# Patient Record
Sex: Female | Born: 1990 | Race: Black or African American | Hispanic: No | Marital: Single | Smoking: Never smoker
Health system: Southern US, Community
[De-identification: ages and names within clinical notes are randomized; demographics above are authoritative.]

---
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
## 2017-05-04 ENCOUNTER — Other Ambulatory Visit: Payer: Self-pay

## 2017-05-04 ENCOUNTER — Emergency Department (HOSPITAL_COMMUNITY)
Admission: EM | Admit: 2017-05-04 | Discharge: 2017-05-04 | Disposition: A | Discharge status: Home / Self Care | Payer: Self-pay | Attending: Emergency Medicine | Admitting: Emergency Medicine

## 2017-05-04 ENCOUNTER — Encounter (HOSPITAL_COMMUNITY): Payer: Self-pay

## 2017-05-04 DIAGNOSIS — N898 Other specified noninflammatory disorders of vagina: Secondary | ICD-10-CM | POA: Insufficient documentation

## 2017-05-04 LAB — I-STAT BETA HCG BLOOD, ED (MC, WL, AP ONLY): I-stat hCG, quantitative: <5 m[IU]/mL (ref <5)

## 2017-05-04 LAB — WET PREP, GENITAL
Clue Cells Wet Prep HPF POC: NONE SEEN
Sperm: NONE SEEN
Trich, Wet Prep: NONE SEEN
Yeast Wet Prep HPF POC: NONE SEEN

## 2017-05-04 LAB — CBC
HCT: 35.4 % — ABNORMAL LOW (ref 36.0–46.0)
Hemoglobin: 11.6 g/dL — ABNORMAL LOW (ref 12.0–15.0)
MCH: 27.4 pg (ref 26.0–34.0)
MCHC: 32.8 g/dL (ref 30.0–36.0)
MCV: 83.7 fL (ref 78.0–100.0)
PLATELETS: 365 10*3/uL (ref 150–400)
RBC: 4.23 MIL/uL (ref 3.87–5.11)
RDW: 14.5 % (ref 11.5–15.5)
WBC: 4.9 10*3/uL (ref 4.0–10.5)

## 2017-05-04 LAB — COMPREHENSIVE METABOLIC PANEL
ALT: 18 U/L (ref 14–54)
AST: 26 U/L (ref 15–41)
Albumin: 3.9 g/dL (ref 3.5–5.0)
Alkaline Phosphatase: 48 U/L (ref 38–126)
Anion gap: 8 (ref 5–15)
BUN: 7 mg/dL (ref 6–20)
CO2: 24 mmol/L (ref 22–32)
CREATININE: 0.89 mg/dL (ref 0.44–1.00)
Calcium: 9.3 mg/dL (ref 8.9–10.3)
Chloride: 107 mmol/L (ref 101–111)
Glucose, Bld: 87 mg/dL (ref 65–99)
Potassium: 4.2 mmol/L (ref 3.5–5.1)
Sodium: 139 mmol/L (ref 135–145)
Total Bilirubin: 0.3 mg/dL (ref 0.3–1.2)
Total Protein: 6.8 g/dL (ref 6.5–8.1)

## 2017-05-04 LAB — URINALYSIS, ROUTINE W REFLEX MICROSCOPIC
BILIRUBIN URINE: NEGATIVE
GLUCOSE, UA: NEGATIVE mg/dL
HGB URINE DIPSTICK: NEGATIVE
KETONES UR: NEGATIVE mg/dL
Leukocytes, UA: NEGATIVE
Nitrite: NEGATIVE
PROTEIN: NEGATIVE mg/dL
Specific Gravity, Urine: 1.018 (ref 1.005–1.030)
pH: 8 (ref 5.0–8.0)

## 2017-05-04 LAB — LIPASE, BLOOD: Lipase: 35 U/L (ref 11–51)

## 2017-05-04 MED ORDER — CEFTRIAXONE SODIUM 250 MG IJ SOLR
250.0000 mg | Freq: Once | INTRAMUSCULAR | Status: AC
  Administered 2017-05-04: 250 mg via INTRAMUSCULAR
  Filled 2017-05-04: qty 250

## 2017-05-04 MED ORDER — AZITHROMYCIN 250 MG PO TABS
1000.0000 mg | ORAL_TABLET | Freq: Once | ORAL | Status: AC
  Administered 2017-05-04: 1000 mg via ORAL
  Filled 2017-05-04: qty 4

## 2017-05-04 MED ORDER — STERILE WATER FOR INJECTION IJ SOLN
INTRAMUSCULAR | Status: AC
  Administered 2017-05-04: 10 mL
  Filled 2017-05-04: qty 10

## 2017-05-04 NOTE — ED Triage Notes (Signed)
Pt presents for evaluation of lower abd pain. States had stomach bug over weekend with N/V/D that has now resolved but pain is ongoing. Denies urinary symptoms. LMP 3/27.

## 2017-05-04 NOTE — ED Provider Notes (Signed)
[facility]   CSN: [id number] Arrival date & time: 05/04/17  1219     History   Chief Complaint Chief Complaint  Patient presents with  . Abdominal Pain    HPI Carolyn FlemingsKiara Fox is a 27 y.o. female.  HPI   [age] year old female presents today with complaints of pelvic pain.  Patient reports that over the last several days she has had vaginal cramping and post intercourse pain.  She notes she is also had vaginal discharge which is abnormal for her.  She denies any significant pain at this time, denies any nausea vomiting, fever chills, denies any abdominal pain.  Patient reports she is sexually active with one female partner.  Patient denies any other complaints here today.  Patient does report a history of STDs in the past.  She denies any urinary complaints.   History reviewed. No pertinent past medical history.  Patient Active Problem List   Diagnosis Date Noted  . VISUAL ACUITY, DECREASED 01/31/2008    History reviewed. No pertinent surgical history.   OB History   None      Home Medications    Prior to Admission medications   Medication Sig Start Date End Date Taking? Authorizing Provider  nitrofurantoin, macrocrystal-monohydrate, (MACROBID) 100 MG capsule Take 1 capsule (100 mg total) by mouth 2 (two) times daily. X 7 days 12/15/11   Ivonne Andrewammen, Peter, PA-C    Family History No family history on file.  Social History Social History   Tobacco Use  . Smoking status: Never Smoker  Substance Use Topics  . Alcohol use: No  . Drug use: No     Allergies   Patient has no known allergies.   Review of Systems Review of Systems  All other systems reviewed and are negative.    Physical Exam Updated Vital Signs BP 132/84 (BP Location: Right Arm)   Pulse (!) 58   Temp 97.6 F (36.4 C) (Oral)   Resp 18   LMP 04/19/2017   SpO2 97%   Physical Exam  Constitutional: She is oriented to person, place, and time. She  appears well-developed and well-nourished.  HENT:  Head: Normocephalic and atraumatic.  Eyes: Pupils are equal, round, and reactive to light. Conjunctivae are normal. Right eye exhibits no discharge. Left eye exhibits no discharge. No scleral icterus.  Neck: Normal range of motion. No JVD present. No tracheal deviation present.  Pulmonary/Chest: Effort normal. No stridor.  Abdominal: Soft. She exhibits no distension and no mass. There is no tenderness. There is no rebound and no guarding. No hernia.  Genitourinary:  Genitourinary Comments: Small amount of sticky white vaginal discharge, no cervical motion tenderness, no adnexal masses or tenderness  Neurological: She is alert and oriented to person, place, and time. Coordination normal.  Psychiatric: She has a normal mood and affect. Her behavior is normal. Judgment and thought content normal.  Nursing note and vitals reviewed.   ED Treatments / Results  Labs (all labs ordered are listed, but only abnormal results are displayed) Labs Reviewed  WET PREP, GENITAL - Abnormal; Notable for the following components:      Result Value   WBC, Wet Prep HPF POC FEW (*)    All other components within normal limits  CBC - Abnormal; Notable for the following components:   Hemoglobin 11.6 (*)    HCT 35.4 (*)    All other components within normal limits  LIPASE, BLOOD  COMPREHENSIVE METABOLIC PANEL  URINALYSIS, ROUTINE W  REFLEX MICROSCOPIC  I-STAT BETA HCG BLOOD, ED ([facility], [facility], [facility] ONLY)  GC/CHLAMYDIA PROBE AMP ([place]) NOT AT [facility]    EKG None  Radiology No results found.  Procedures Procedures (including critical care time)  Medications Ordered in ED Medications  cefTRIAXone (ROCEPHIN) injection 250 mg (250 mg Intramuscular Given 05/04/17 1641)  azithromycin (ZITHROMAX) tablet 1,000 mg (1,000 mg Oral Given 05/04/17 1640)  sterile water (preservative free) injection (10 mLs  Given 05/04/17 1641)     Initial Impression / Assessment  and Plan / ED Course  I have reviewed the triage vital signs and the nursing notes.  Pertinent labs & imaging results that were available during my care of the patient were reviewed by me and considered in my medical decision making (see chart for details).      Final Clinical Impressions(s) / ED Diagnoses   Final diagnoses:  Vaginal discharge   Labs: Wet prep, i-STAT beta-hCG, lipase, CMP, CBC  Imaging:  Consults:  Therapeutics:  Discharge Meds:   Assessment/Plan:   27 year old female presents today with concerns of STD.  Patient does have a small amount of discharge, no significant purulence or signs of PID.  Patient does report her sexual partner does have an STD.  She will be treated prophylactically for gonorrhea and chlamydia, wet prep without significant findings.  Patient has no lower abdominal pain fever or any other acute etiology.  Low suspicion for torsion or any masses.  Patient will follow-up as an outpatient if symptoms persist and return to the emergency room if they worsen.  She verbalized understanding and agreement to today's plan had no further questions or concerns  ED Discharge Orders    None       Rosalio Loud 05/04/17 1803    Azalia Bilis, MD 05/04/17 2303

## 2017-05-04 NOTE — Discharge Instructions (Addendum)
Please read attached information. If you experience any new or worsening signs or symptoms please return to the emergency room for evaluation. Please follow-up with your primary care provider or specialist as discussed.  °

## 2017-05-05 LAB — GC/CHLAMYDIA PROBE AMP (~~LOC~~) NOT AT ARMC
Chlamydia: NEGATIVE
Neisseria Gonorrhea: NEGATIVE

## 2020-01-22 ENCOUNTER — Encounter (HOSPITAL_COMMUNITY): Payer: Self-pay

## 2020-01-22 ENCOUNTER — Emergency Department (HOSPITAL_COMMUNITY)
Admission: EM | Admit: 2020-01-22 | Discharge: 2020-01-22 | Disposition: A | Discharge status: Home / Self Care | Payer: Medicaid Other | Attending: Emergency Medicine | Admitting: Emergency Medicine

## 2020-01-22 ENCOUNTER — Emergency Department (HOSPITAL_COMMUNITY): Payer: Medicaid Other

## 2020-01-22 DIAGNOSIS — S8992XA Unspecified injury of left lower leg, initial encounter: Secondary | ICD-10-CM | POA: Diagnosis not present

## 2020-01-22 DIAGNOSIS — M542 Cervicalgia: Secondary | ICD-10-CM

## 2020-01-22 DIAGNOSIS — S0990XA Unspecified injury of head, initial encounter: Secondary | ICD-10-CM | POA: Diagnosis present

## 2020-01-22 DIAGNOSIS — Y92410 Unspecified street and highway as the place of occurrence of the external cause: Secondary | ICD-10-CM | POA: Diagnosis not present

## 2020-01-22 DIAGNOSIS — S199XXA Unspecified injury of neck, initial encounter: Secondary | ICD-10-CM | POA: Diagnosis not present

## 2020-01-22 DIAGNOSIS — S4992XA Unspecified injury of left shoulder and upper arm, initial encounter: Secondary | ICD-10-CM | POA: Insufficient documentation

## 2020-01-22 MED ORDER — IBUPROFEN 800 MG PO TABS
800.0000 mg | ORAL_TABLET | Freq: Once | ORAL | Status: AC
  Administered 2020-01-22: 800 mg via ORAL
  Filled 2020-01-22: qty 1
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
## 2020-01-22 NOTE — Discharge Instructions (Addendum)
Ibuprofen for pain.  Return if any problems  °
